# Patient Record
Sex: Male | Born: 1985 | Hispanic: Yes | Marital: Single | Smoking: Never smoker
Health system: Southern US, Community
[De-identification: ages and names within clinical notes are randomized; demographics above are authoritative.]

## PROBLEM LIST (undated history)

## (undated) DIAGNOSIS — K219 Gastro-esophageal reflux disease without esophagitis: Secondary | ICD-10-CM

## (undated) DIAGNOSIS — E785 Hyperlipidemia, unspecified: Secondary | ICD-10-CM

## (undated) DIAGNOSIS — I10 Essential (primary) hypertension: Secondary | ICD-10-CM

## (undated) HISTORY — DX: Essential (primary) hypertension: I10

## (undated) HISTORY — DX: Hyperlipidemia, unspecified: E78.5

## (undated) HISTORY — DX: Gastro-esophageal reflux disease without esophagitis: K21.9

---

## 2018-10-30 ENCOUNTER — Encounter: Payer: Self-pay | Admitting: Gastroenterology

## 2018-11-11 ENCOUNTER — Encounter: Payer: Self-pay | Admitting: Gastroenterology

## 2018-11-11 ENCOUNTER — Other Ambulatory Visit: Payer: Self-pay

## 2018-11-11 ENCOUNTER — Ambulatory Visit: Payer: Self-pay | Admitting: Gastroenterology

## 2018-11-11 VITALS — BP 136/90 | HR 68 | Temp 98.2°F | Ht 63.0 in | Wt 168.2 lb

## 2018-11-11 DIAGNOSIS — Z1159 Encounter for screening for other viral diseases: Secondary | ICD-10-CM

## 2018-11-11 DIAGNOSIS — R079 Chest pain, unspecified: Secondary | ICD-10-CM

## 2018-11-11 DIAGNOSIS — R1310 Dysphagia, unspecified: Secondary | ICD-10-CM

## 2018-11-11 DIAGNOSIS — R7989 Other specified abnormal findings of blood chemistry: Secondary | ICD-10-CM

## 2018-11-11 DIAGNOSIS — R0989 Other specified symptoms and signs involving the circulatory and respiratory systems: Secondary | ICD-10-CM

## 2018-11-11 NOTE — Progress Notes (Signed)
Chief Complaint: Chest tightness  Referring Provider:     Rennis Harding, FNP  HPI:    Rickey Hogan is a 33 y.o. male with a history of dyslipidemia, hypertension, referred to the Gastroenterology Clinic for evaluation of chest tightness and foreign body sensation in the esophagus.  He states he was eating fish and thinks he swallowed a bone approximately 2-3 weeks ago.  Started having chest tightness and foreign body sensation that evening and into the next day.  Describes discomfort suprasternal notch to MEG. No dysphagia, just pain and sensation that something is still there.  No nausea, vomiting. No prior hx of reflux. No prior similar sxs.   Was seen by his PCM for this issue earlier this month.  Prescribed omeprazole 40 mg/day and Carafate with some improvement.   No known family history of CRC, GI malignancy, liver disease, pancreatic disease, or IBD.   Separately, labs from 10/29/2018 n/f elevated LAEs: AST/ALT 44/74 with T bili 1.3 and ALP 109.  Otherwise normal BMP.  No imaging for review. No prior labs for review. No known personal or FHx of liver disease.   History provided via interpretor.   Past Medical History:  Diagnosis Date  . GERD (gastroesophageal reflux disease)   . Hyperlipidemia   . Hypertension      History reviewed. No pertinent surgical history. Family History  Problem Relation Age of Onset  . Colon cancer Neg Hx   . Esophageal cancer Neg Hx    Social History   Tobacco Use  . Smoking status: Never Smoker  . Smokeless tobacco: Never Used  Substance Use Topics  . Alcohol use: Not Currently    Comment: drank a month ago   . Drug use: Never   Current Outpatient Medications  Medication Sig Dispense Refill  . atorvastatin (LIPITOR) 10 MG tablet 1 tablet daily.    Marland Kitchen lisinopril (ZESTRIL) 20 MG tablet 1 tablet daily.    Marland Kitchen omeprazole (PRILOSEC) 40 MG capsule 1 capsule daily.    . sucralfate (CARAFATE) 1 g tablet 1 tablet QID.     No  current facility-administered medications for this visit.    Not on File   Review of Systems: All systems reviewed and negative except where noted in HPI.     Physical Exam:    Wt Readings from Last 3 Encounters:  11/11/18 168 lb 4 oz (76.3 kg)    BP 136/90   Pulse 68   Temp 98.2 F (36.8 C)   Ht 5\' 3"  (1.6 m)   Wt 168 lb 4 oz (76.3 kg)   BMI 29.80 kg/m  Constitutional:  Pleasant, in no acute distress. Psychiatric: Normal mood and affect. Behavior is normal. EENT: Pupils normal.  Conjunctivae are normal. No scleral icterus. Neck supple. No cervical LAD. Cardiovascular: Normal rate, regular rhythm. No edema Pulmonary/chest: Effort normal and breath sounds normal. No wheezing, rales or rhonchi. Abdominal: Soft, nondistended, nontender. Bowel sounds active throughout. There are no masses palpable. No hepatomegaly. Neurological: Alert and oriented to person place and time. Skin: Skin is warm and dry. No rashes noted.   ASSESSMENT AND PLAN;   1) Chest tightness 2) Non-cardaic chest pain 3) Globus sensation  - EGD to r/o mucosal/luminal etiology or e/o silent reflux esophagitis - Resume Prilosec and Carafate  4) Elevated ALT/AST: 5) Borderline Elevated T bili -RUQ - Repeat LAEs - Check viral hep panel  The indications, risks, and  benefits of EGD were explained to the patient in detail. Risks include but are not limited to bleeding, perforation, adverse reaction to medications, and cardiopulmonary compromise. Sequelae include but are not limited to the possibility of surgery, hositalization, and mortality. The patient verbalized understanding and wished to proceed. All questions answered, referred to scheduler. Further recommendations pending results of the exam.     Lavena Bullion, DO, FACG  11/11/2018, 10:46 AM   Salvadore Farber, FNP

## 2018-11-11 NOTE — Patient Instructions (Signed)
If you are age 35 or older, your body mass index should be between 23-30. Your Body mass index is 29.8 kg/m. If this is out of the aforementioned range listed, please consider follow up with your Primary Care Provider.  If you are age 53 or younger, your body mass index should be between 19-25. Your Body mass index is 29.8 kg/m. If this is out of the aformentioned range listed, please consider follow up with your Primary Care Provider.   To help prevent the possible spread of infection to our patients, communities, and staff; we will be implementing the following measures:  As of now we are not allowing any visitors/family members to accompany you to any upcoming appointments with [facility]. If you have any concerns about this please contact our office to discuss prior to the appointment.   You have been scheduled for an endoscopy. Please follow written instructions given to you at your visit today. If you use inhalers (even only as needed), please bring them with you on the day of your procedure. Your physician has requested that you go to www.startemmi.com and enter the access code given to you at your visit today. This web site gives a general overview about your procedure. However, you should still follow specific instructions given to you by our office regarding your preparation for the procedure.  Due to recent COVID-19 restrictions implemented by [employer] and state authorities and in an effort to keep both patients and staff as safe as possible, [place] requires COVID-19 testing prior to any scheduled endoscopic procedure. The testing center is located at [address] [place], [place] [address] in the [facility] [employer]  suite.  Your appointment has been scheduled for 3:00pm on 11/19/2018.   Please bring your insurance cards to this appointment. You will require your COVID screen 2 business days prior to your  endoscopic procedure.  You are not required to quarantine after your screening.  You will only receive a phone call with the results if it is POSITIVE.  If you do not receive a call the day before your procedure you should begin your prep, if ordered, and you should report to the endo center for your procedure at your designated appointment arrival time ( one hour prior to the procedure time). There is no cost to you for the screening on the day of the swab.  [facility] will file with your insurance company for the testing.    You may receive an automated phone call prior to your procedure or have a message in your MyChart that you have an appointment for a BP/15 at the [facility], please disregard this message.  Your testing will be at the [address] [place] location.   If you are leaving [facility] travel [place] on [address] [place] [address], turn left onto [address], turn night onto [address] at the 1st stop light turn right, pass the [employer] on your right and proceed to [place] (white building).    You have been scheduled for an abdominal ultrasound at [facility] (1st floor [address] ). Please arrive 15 minutes prior to your appointment for registration. Make certain not to have anything to eat or drink 6 hours prior to your appointment. Should you need to reschedule your appointment, please contact radiology at [phone]. This test typically takes about 30 minutes to perform.  Your provider has  requested that you go to the basement level for lab work at our [place] location ([address] [place] [address]) . Press "B" on the elevator. The lab is located at the first door on the left as you exit the elevator. You may go at whatever time is convienent for you. The current hours of operations are Monday- Friday 7:30am-4:30pm.  It was a pleasure to see you today!  Vito  Cirigliano, D.O.

## 2018-11-14 ENCOUNTER — Other Ambulatory Visit: Payer: Self-pay

## 2018-11-14 ENCOUNTER — Ambulatory Visit (HOSPITAL_BASED_OUTPATIENT_CLINIC_OR_DEPARTMENT_OTHER)
Admission: RE | Admit: 2018-11-14 | Discharge: 2018-11-14 | Disposition: A | Discharge status: Home / Self Care | Payer: Self-pay | Source: Ambulatory Visit | Attending: Gastroenterology | Admitting: Gastroenterology

## 2018-11-14 DIAGNOSIS — R7989 Other specified abnormal findings of blood chemistry: Secondary | ICD-10-CM | POA: Insufficient documentation

## 2018-11-14 DIAGNOSIS — R079 Chest pain, unspecified: Secondary | ICD-10-CM | POA: Insufficient documentation

## 2018-11-14 DIAGNOSIS — R1310 Dysphagia, unspecified: Secondary | ICD-10-CM | POA: Insufficient documentation

## 2018-11-19 ENCOUNTER — Other Ambulatory Visit: Payer: Self-pay | Admitting: Gastroenterology

## 2018-11-20 LAB — SARS CORONAVIRUS 2 (TAT 6-24 HRS): SARS Coronavirus 2: NEGATIVE

## 2018-11-21 ENCOUNTER — Other Ambulatory Visit: Payer: Self-pay

## 2018-11-21 ENCOUNTER — Encounter: Payer: Self-pay | Admitting: Gastroenterology

## 2018-11-21 ENCOUNTER — Ambulatory Visit (AMBULATORY_SURGERY_CENTER): Payer: Self-pay | Admitting: Gastroenterology

## 2018-11-21 VITALS — BP 121/76 | HR 64 | Temp 98.5°F | Resp 20

## 2018-11-21 DIAGNOSIS — R1310 Dysphagia, unspecified: Secondary | ICD-10-CM

## 2018-11-21 DIAGNOSIS — K2990 Gastroduodenitis, unspecified, without bleeding: Secondary | ICD-10-CM

## 2018-11-21 DIAGNOSIS — R0989 Other specified symptoms and signs involving the circulatory and respiratory systems: Secondary | ICD-10-CM

## 2018-11-21 DIAGNOSIS — K2970 Gastritis, unspecified, without bleeding: Secondary | ICD-10-CM

## 2018-11-21 DIAGNOSIS — R079 Chest pain, unspecified: Secondary | ICD-10-CM

## 2018-11-21 MED ORDER — SODIUM CHLORIDE 0.9 % IV SOLN: 500.0000 mL | INTRAVENOUS | Status: DC

## 2018-11-21 NOTE — Progress Notes (Signed)
Called to room to assist during endoscopic procedure.  Patient ID and intended procedure confirmed with present staff. Received instructions for my participation in the procedure from the performing physician.  

## 2018-11-21 NOTE — Op Note (Signed)
[facility] Patient Name: Rickey AmelRenato Hogan Procedure Date: 11/21/2018 4:15 PM MRN: [id number] Endoscopist: Doristine LocksVito  , MD Age: [age] Referring MD:  Date of Birth: 04/27/1985 Gender: Male Account #: [id number] Procedure:                Upper GI endoscopy Indications:              Odynophagia, Chest pain (non cardiac), Globus                            sensation                           33 yo male with odynophagia and sensation of                            foreign body in esophagus after eating fish                            recently. Also with non-cardiac chest pain/chest                            tightness. Medicines:                Monitored Anesthesia Care Procedure:                Pre-Anesthesia Assessment:                           - Prior to the procedure, a History and Physical                            was performed, and patient medications and                            allergies were reviewed. The patient's tolerance of                            previous anesthesia was also reviewed. The risks                            and benefits of the procedure and the sedation                            options and risks were discussed with the patient.                            All questions were answered, and informed consent                            was obtained. Prior Anticoagulants: The patient has                            taken no previous anticoagulant or antiplatelet  agents. ASA Grade Assessment: II - A patient with                            mild systemic disease. After reviewing the risks                            and benefits, the patient was deemed in                            satisfactory condition to undergo the procedure.                           After obtaining informed consent, the endoscope was                            passed under direct vision. Throughout the                            procedure, the patient's  blood pressure, pulse, and                            oxygen saturations were monitored continuously. The                            Endoscope was introduced through the mouth, and                            advanced to the second part of duodenum. The upper                            GI endoscopy was accomplished without difficulty.                            The patient tolerated the procedure well. Scope In: Scope Out: Findings:                 The examined esophagus was normal. No areas of                            erythema, edema, rings, strictures, or luminal                            narrowing noted.                           Esophagogastric landmarks were identified: the                            Z-line was found at 35 cm, the gastroesophageal                            junction was found at 35 cm and the site of hiatal  narrowing was found at 35 cm from the incisors.                           The gastroesophageal flap valve was visualized                            endoscopically and classified as Hill Grade II                            (fold present, opens with respiration).                           Scattered minimal inflammation characterized by                            erythema was found in the gastric body and in the                            gastric antrum. Biopsies were taken with a cold                            forceps for Helicobacter pylori testing. Estimated                            blood loss was minimal.                           The duodenal bulb, first portion of the duodenum                            and second portion of the duodenum were normal. Complications:            No immediate complications. Estimated Blood Loss:     Estimated blood loss was minimal. Impression:               - Normal esophagus.                           - Esophagogastric landmarks identified.                           - Gastroesophageal flap valve  classified as Hill                            Grade II (fold present, opens with respiration).                           - Gastritis. Biopsied.                           - Normal duodenal bulb, first portion of the                            duodenum and second portion of the duodenum. Recommendation:           - Patient has a contact number  available for                            emergencies. The signs and symptoms of potential                            delayed complications were discussed with the                            patient. Return to normal activities tomorrow.                            Written discharge instructions were provided to the                            patient.                           - Resume previous diet.                           - Continue present medications.                           - Await pathology results.                           - Return to GI clinic PRN. Rickey Heck, MD 11/21/2018 4:33:40 PM

## 2018-11-21 NOTE — Progress Notes (Signed)
A/ox3, pleased with MAC, report to RN 

## 2018-11-21 NOTE — Patient Instructions (Addendum)
Impression/Recommendations:  Gastritis handout given to patient.  Resume previous diet. Continue present medications.  Return to GI clinic as needed.  Call [phone] if you have any questions or concerns.  Endoscopa alta en los adultos Upper Endoscopy, Adult Una endoscopa alta es un procedimiento que permite observar dentro de la porcin alta del tracto GI (gastrointestinal). La porcin alta del tracto GI se compone de lo siguiente:  La parte del cuerpo que transporta los alimentos desde la boca hasta el estmago (esfago).  El [place].  La primera parte del intestino delgado (duodeno). Este procedimiento tambin se conoce como esofagogastroduodenoscopia (EGD) o gastrostoma. En este procedimiento, el mdico introduce un tubo delgado y flexible (endoscopio) a travs de la boca y por el esfago, hasta el [place]. Se fija una pequea cmara al extremo del tubo. La cmara captura imgenes que se reproducen en un monitor de la sala de examen. Durante este procedimiento, el mdico tambin podra extraer una pequea porcin de tejido para enviarla a un laboratorio a que la examinen con un microscopio (biopsia). El mdico podra realizar una endoscopa alta para diagnosticar distintos tipos de cncer en la porcin alta del tracto GI. Es posible que Solicitor procedimiento para Company secretary causa de ciertas afecciones, como:  Dolor de [place].  Acidez estomacal.  Dolor o problemas al tragar.  Nuseas y vmitos.  Hemorragia estomacal.  lceras estomacales. Informe al mdico acerca de lo siguiente:  Cualquier alergia que tenga.  Todos los [employer], incluidos vitaminas, hierbas, gotas oftlmicas, cremas y [address] de [address].  Cualquier problema previo que usted o algn miembro de su familia haya tenido con los anestsicos.  Cualquier trastorno de la sangre que tenga.  Cirugas a las que se haya sometido.  Cualquier afeccin mdica que tenga.   Si est embarazada o podra estarlo. Cules son los riesgos? En general, se trata de un procedimiento seguro. Sin embargo, pueden ocurrir complicaciones, por ejemplo:  Infeccin.  Sangrado.  Reacciones alrgicas a los medicamentos.  Un desgarro u orificio (perforacin) del esfago, el estmago o el duodeno. Qu ocurre antes del procedimiento? Hidratacin Siga las indicaciones del mdico acerca de mantenerse hidratado, las cuales pueden incluir lo siguiente:  Hasta 2horas antes del procedimiento, puede beber lquidos transparentes, como agua, jugos de fruta sin pulpa, caf negro y t solo.  Restricciones en las comidas y bebidas Siga las indicaciones del mdico respecto de las comidas y bebidas, las cuales pueden incluir lo siguiente:  8 horas antes del procedimiento, no coma alimentos pesados, por ejemplo, carnes, alimentos con alto contenido graso o fritos.  6 horas antes del procedimiento, deje de ingerir comidas o alimentos livianos, como tostadas o cereales.  6 horas antes del procedimiento, deje de tomar [address] o bebidas que [employer].  2 horas antes del procedimiento, deje de beber lquidos transparentes. Medicamentos Consulte al mdico sobre:  Multimedia programmer o suspender los medicamentos que toma habitualmente. Esto es muy importante si toma medicamentos para la diabetes o anticoagulantes.  Tomar medicamentos como aspirina e ibuprofeno. Estos medicamentos pueden tener un efecto anticoagulante en la [place]. No tome estos medicamentos a menos que el mdico se lo indique.  Tomar medicamentos de [employer], vitaminas, hierbas y suplementos. Indicaciones generales  Haga que alguien lo lleve a su casa desde el hospital o la clnica.  Si se ir a su casa inmediatamente despus del procedimiento, pdale a alguien que se quede con usted durante 24horas.  Pregntele al mdico qu medidas se tomarn para ayudar a  prevenir una infeccin. Qu ocurre durante el procedimiento?    Le colocarn una va intravenosa en una de las venas.  Pueden administrarle uno o ms de los siguientes medicamentos: ? Un medicamento para ayudarlo a relajarse (sedante). ? Un medicamento para adormecer la garganta (anestesia local).  Deber recostarse del costado izquierdo sobre una camilla.  El mdico har pasar el endoscopio a travs de la boca y Medical illustrator.  Elmdico utilizar el endoscopio para ver el interior de su esfago, estmago y [place]. Podran realizarse biopsias.  El endoscopio se Charity fundraiser. El procedimiento puede variar segn el mdico y el hospital. Sander Nephew ocurre despus del procedimiento?  Le controlarn la presin arterial, la frecuencia cardaca, la frecuencia respiratoria y Retail buyer de oxgeno en la sangre hasta que le den el alta del hospital o la clnica.  No conduzca durante 24horas si le administraron un sedante durante el procedimiento.  Cuando la garganta ya no est adormecida, le pueden dar lquido para beber.  Es su responsabilidad retirar [employer] del procedimiento. Pregntele al mdico o a alguien del departamento donde se realice el procedimiento cundo estarn [employer]. Resumen  Una endoscopa alta es un procedimiento que permite observar dentro de la porcin alta del tracto GI.  Durante el procedimiento, le colocarn una va intravenosa en una de las venas. Es posible que le administren un medicamento para ayudarlo a Nurse, children's.  Usarn un medicamento para anestesiarle la garganta.  Le introducirn el endoscopio por la boca y lo llevarn Programmer, applications. Esta informacin no tiene Marine scientist el consejo del mdico. Asegrese de hacerle al mdico cualquier pregunta que tenga. Document Released: 10/11/2004 Document Revised: 07/11/2017 Document Reviewed: 07/11/2017 Elsevier Patient Education  2020 [employer].

## 2018-11-25 ENCOUNTER — Telehealth: Payer: Self-pay

## 2018-11-25 NOTE — Telephone Encounter (Signed)
NO ANSWER, MESSAGE LEFT FOR PATIENT. 

## 2018-11-25 NOTE — Telephone Encounter (Signed)
  Follow up Call-  Call back number 11/21/2018  Post procedure Call Back phone  # [phone]  Permission to leave phone message Yes     Patient questions:  Do you have a fever, pain , or abdominal swelling? No. Pain Score  0 *  Have you tolerated food without any problems? Yes.    Have you been able to return to your normal activities? Yes.    Do you have any questions about your discharge instructions: Diet   No. Medications  No. Follow up visit  No.  Do you have questions or concerns about your Care? No.  Actions: * If pain score is 4 or above: 1. No action needed, pain <4.Have you developed a fever since your procedure? no  2.   Have you had an respiratory symptoms (SOB or cough) since your procedure? no  3.   Have you tested positive for COVID 19 since your procedure no  4.   Have you had any family members/close contacts diagnosed with the COVID 19 since your procedure?  no   If yes to any of these questions please route to Joylene John, RN and Alphonsa Gin, Therapist, sports.

## 2018-12-04 ENCOUNTER — Other Ambulatory Visit: Payer: Self-pay | Admitting: Gastroenterology

## 2018-12-05 ENCOUNTER — Other Ambulatory Visit: Payer: Self-pay | Admitting: Gastroenterology

## 2018-12-05 DIAGNOSIS — B9681 Helicobacter pylori [H. pylori] as the cause of diseases classified elsewhere: Secondary | ICD-10-CM

## 2018-12-05 DIAGNOSIS — K2970 Gastritis, unspecified, without bleeding: Secondary | ICD-10-CM

## 2019-01-19 ENCOUNTER — Other Ambulatory Visit (INDEPENDENT_AMBULATORY_CARE_PROVIDER_SITE_OTHER): Payer: Self-pay

## 2019-01-19 DIAGNOSIS — R1310 Dysphagia, unspecified: Secondary | ICD-10-CM

## 2019-01-19 DIAGNOSIS — R7989 Other specified abnormal findings of blood chemistry: Secondary | ICD-10-CM

## 2019-01-19 DIAGNOSIS — R079 Chest pain, unspecified: Secondary | ICD-10-CM

## 2019-01-19 LAB — HEPATIC FUNCTION PANEL
ALT: 38 U/L (ref 0–53)
AST: 23 U/L (ref 0–37)
Albumin: 4.6 g/dL (ref 3.5–5.2)
Alkaline Phosphatase: 83 U/L (ref 39–117)
Bilirubin, Direct: 0.3 mg/dL (ref 0.0–0.3)
Total Bilirubin: 2.4 mg/dL — ABNORMAL HIGH (ref 0.2–1.2)
Total Protein: 7.8 g/dL (ref 6.0–8.3)

## 2019-01-20 ENCOUNTER — Other Ambulatory Visit: Payer: Self-pay

## 2019-01-20 DIAGNOSIS — K2970 Gastritis, unspecified, without bleeding: Secondary | ICD-10-CM

## 2019-01-20 DIAGNOSIS — B9681 Helicobacter pylori [H. pylori] as the cause of diseases classified elsewhere: Secondary | ICD-10-CM

## 2019-01-20 LAB — HEPATITIS C ANTIBODY
Hepatitis C Ab: NONREACTIVE
SIGNAL TO CUT-OFF: 0.02 (ref <1.00)

## 2019-01-20 LAB — HEPATITIS B SURFACE ANTIGEN: Hepatitis B Surface Ag: NONREACTIVE

## 2019-01-20 LAB — HEPATITIS A ANTIBODY, TOTAL: Hepatitis A AB,Total: REACTIVE — AB

## 2019-01-20 LAB — HEPATITIS B SURFACE ANTIBODY,QUALITATIVE: Hep B S Ab: NONREACTIVE

## 2019-01-20 NOTE — Addendum Note (Signed)
Addended by: Jessee Avers on: 01/20/2019 02:47 PM   Modules accepted: Orders

## 2019-01-21 ENCOUNTER — Encounter: Payer: Self-pay | Admitting: Gastroenterology

## 2019-01-21 LAB — HELICOBACTER PYLORI  SPECIAL ANTIGEN
MICRO NUMBER:: 10008491
SPECIMEN QUALITY: ADEQUATE

## 2019-02-10 ENCOUNTER — Encounter: Payer: Self-pay | Admitting: Gastroenterology

## 2019-02-10 ENCOUNTER — Ambulatory Visit: Payer: Self-pay | Admitting: Gastroenterology

## 2019-02-10 ENCOUNTER — Other Ambulatory Visit: Payer: Self-pay

## 2019-02-10 VITALS — BP 126/76 | HR 67 | Temp 98.2°F | Ht 63.0 in | Wt 154.4 lb

## 2019-02-10 DIAGNOSIS — Z8619 Personal history of other infectious and parasitic diseases: Secondary | ICD-10-CM

## 2019-02-10 DIAGNOSIS — Z23 Encounter for immunization: Secondary | ICD-10-CM

## 2019-02-10 DIAGNOSIS — R0989 Other specified symptoms and signs involving the circulatory and respiratory systems: Secondary | ICD-10-CM

## 2019-02-10 NOTE — Progress Notes (Signed)
P  Chief Complaint:    Follow-up of H. pylori gastritis, lab follow-up  GI History: 34 y.o. male with a history of dyslipidemia, hypertension, initially seen in the GI clinic in 10/2018 for feeling of foreign body sensation in the esophagus. Improvement with trial of omeprazole 40 mg/day and Carafate.  Evaluation unrevealing as outlined below.  -EGD (11/2018, Dr. Barron Alvine): Mild H. pylori gastritis, otherwise normal.  Normal esophagus.  Treated with quadruple therapy with confirmation of eradication in 01/2019   Additionally, labs in 10/2018 with incidentally noted elevated LAE's: AST/ALT 44/74 with T bili 1.3 and ALP 109.  Otherwise normal BMP. No prior labs for review. No known personal or FHx of liver disease.  -RUQ [place] (10/2018): Normal -Negative HCV -HAV Ab + c/w immunity -HBsAg-, HBsAb-  : Referred for HBV vaccination -Hepatic panel 01/2019: AST/ALT 23/38, T bili 2.4, D bili 0.3. c/w Gilbert's  HPI:    Patient is a 34 y.o. male presenting to the Gastroenterology Clinic for follow-up.  He states he feels much better after completing quadruple therapy for H. pylori.  Confirmed eradication by labs earlier this month.  No longer taking any PPI and no return of UGI sxs.  Feeling well, tolerating all p.o. intake.  No abdominal pain, nausea, vomiting.  Liver enzymes repeated-normalization of AST/ALT.  Elevated T bili consistent with Gilbert's.  HBV was negative, and presents today to also start HBV vaccine series.  History provided via Spanish interpreter in clinic today.  Review of systems:     No chest pain, no SOB, no fevers, no urinary sx   Past Medical History:  Diagnosis Date  . GERD (gastroesophageal reflux disease)   . Hyperlipidemia   . Hypertension     Patient's surgical history, family medical history, social history, medications and allergies were all reviewed in Epic    Current Outpatient Medications  Medication Sig Dispense Refill  . atorvastatin (LIPITOR)  10 MG tablet 1 tablet daily.    Marland Kitchen lisinopril (ZESTRIL) 20 MG tablet 1 tablet daily.     No current facility-administered medications for this visit.    Physical Exam:     BP 126/76   Pulse 67   Temp 98.2 F (36.8 C)   Ht 5\' 3"  (1.6 m)   Wt 154 lb 6 oz (70 kg)   BMI 27.35 kg/m   GENERAL:  Pleasant male in NAD PSYCH: : Cooperative, normal affect EENT:  conjunctiva pink, mucous membranes moist, neck supple without masses CARDIAC:  RRR, no murmur heard, no peripheral edema PULM: Normal respiratory effort, lungs CTA bilaterally, no wheezing ABDOMEN:  Nondistended, soft, nontender. No obvious masses, no hepatomegaly,  normal bowel sounds SKIN:  turgor, no lesions seen Musculoskeletal:  Normal muscle tone, normal strength NEURO: Alert and oriented x 3, no focal neurologic deficits   IMPRESSION and PLAN:    1) H. pylori gastritis: -Completed quadruple therapy with resolution of upper GI symptoms -Confirmed eradication by labs earlier this month -No further work-up needed at this time  2) Globus sensation: -Resolved.  No recurrence despite having been off all acid suppression therapy for couple of months now -No further work-up needed at this time  3) Need for hepatitis B vaccine: -Initiate HPV vaccine series today  4) Gilbert's Syndrome: -Discussed Gilbert's syndrome at length today.  Discussed benign lab abnormality, and can expect to see exacerbation and labs with acute illness.  Liver enzymes have otherwise normalized -No further work-up needed at this time  F/u prn  I spent 22 minutes of time, including independent review of results as outlined above, communicating results with the patient directly, face-to-face time with the patient, coordinating care, ordering studies and medications as appropriate, and documentation.             [place] ,DO, FACG 02/10/2019, 11:15 AM

## 2019-02-10 NOTE — Patient Instructions (Addendum)
Next Hepatitis B injection will be on 03/13/19 at 3pm  It was a pleasure to see you today!  Vito Cirigliano, D.O.

## 2019-03-13 ENCOUNTER — Other Ambulatory Visit: Payer: Self-pay

## 2021-06-26 IMAGING — US US ABDOMEN LIMITED
1 series · 14 of 25 positions shown · non-contrast
Comparison: None.

CLINICAL DATA: Chest pain and elevated liver enzymes

EXAM:
ULTRASOUND ABDOMEN LIMITED RIGHT UPPER QUADRANT

[Series 1: us abdomen limited · 14 of 71 slices shown]
[im 1/71]
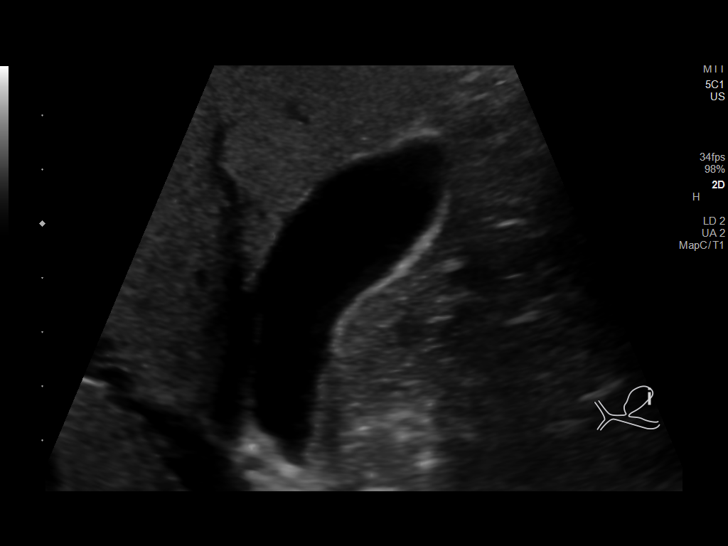
[im 6/71]
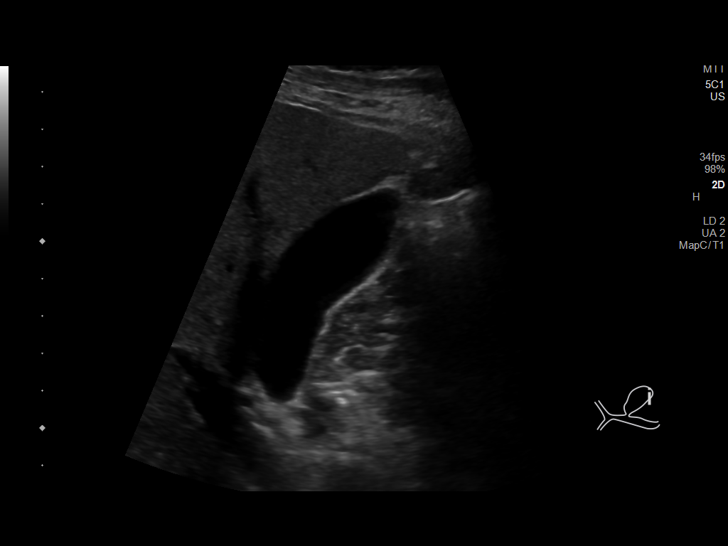
[im 12/71]
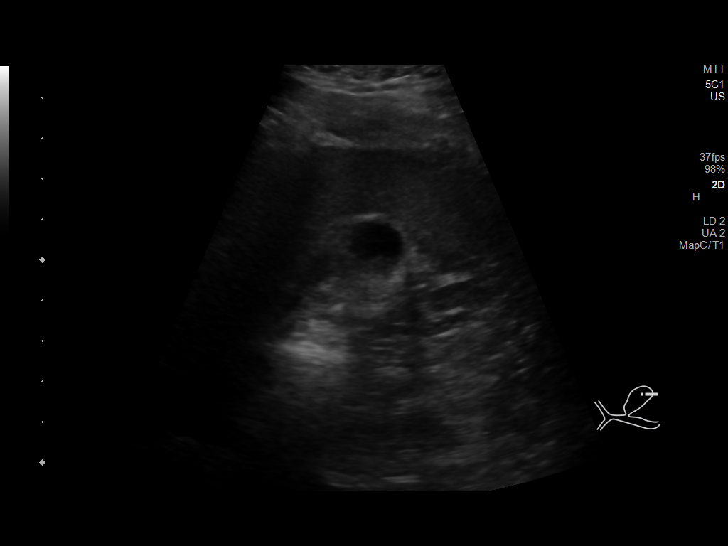
[im 18/71]
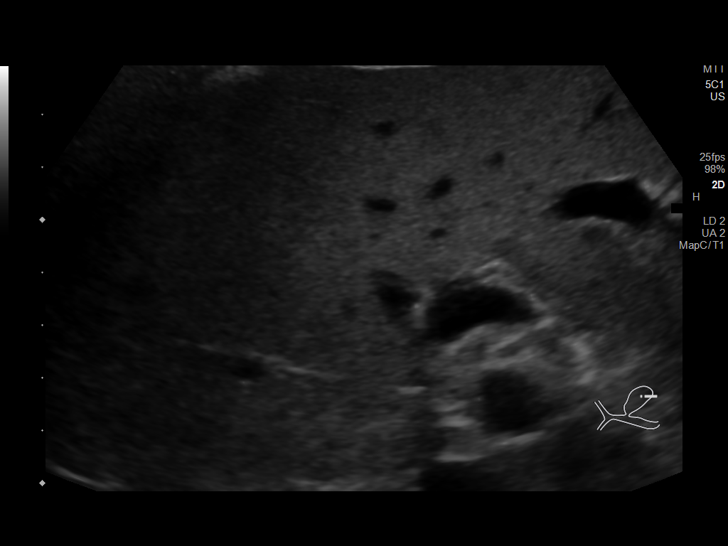
[im 24/71]
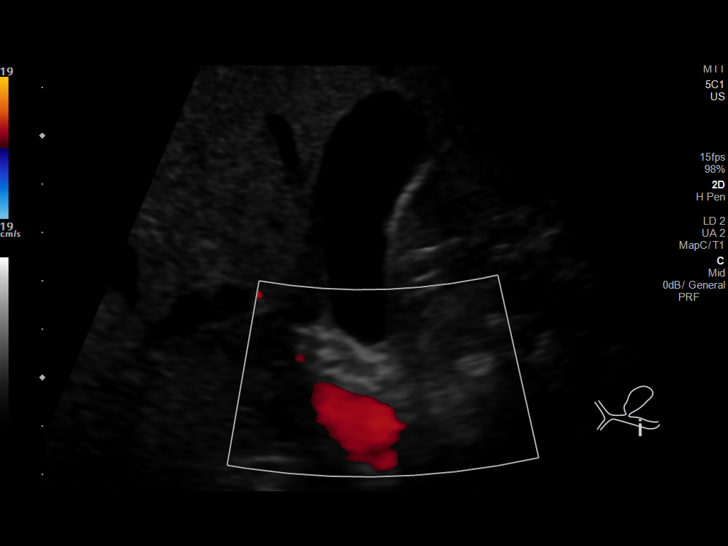
[im 27/71]
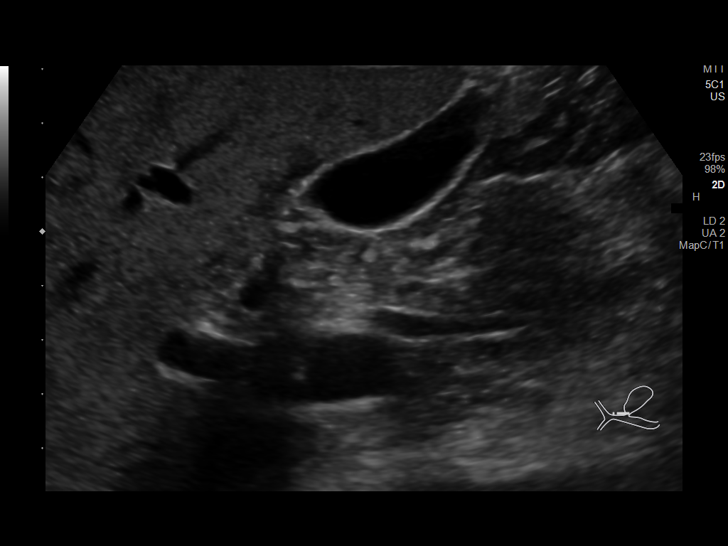
[im 33/71]
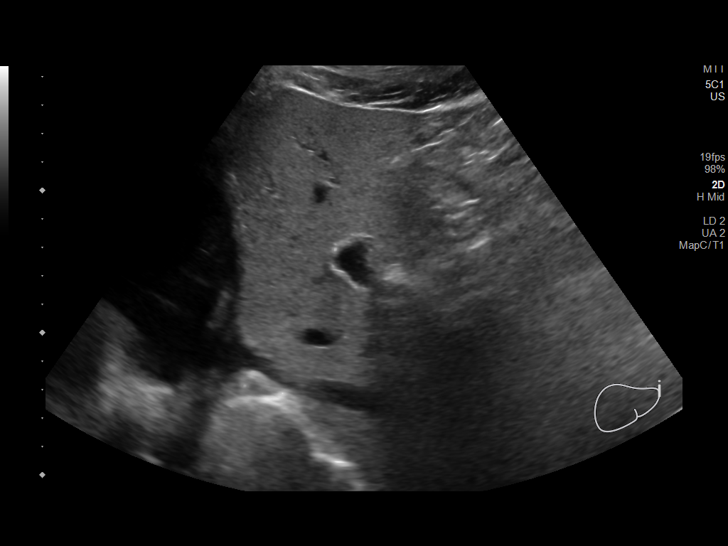
[im 38/71]
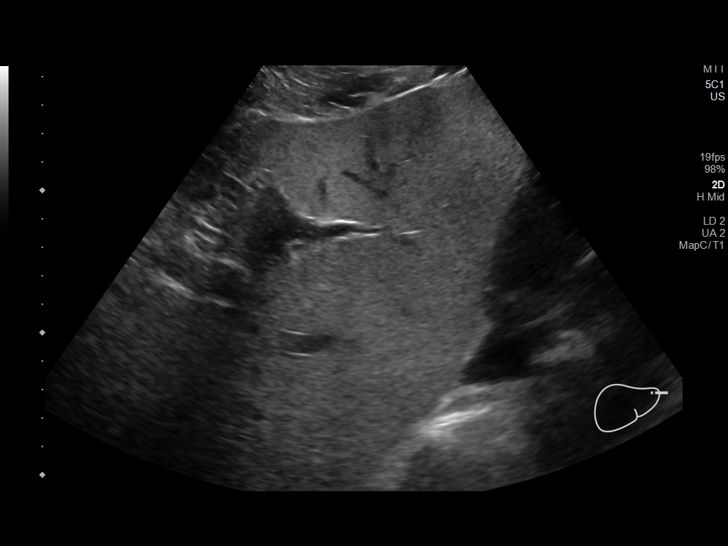
[im 44/71]
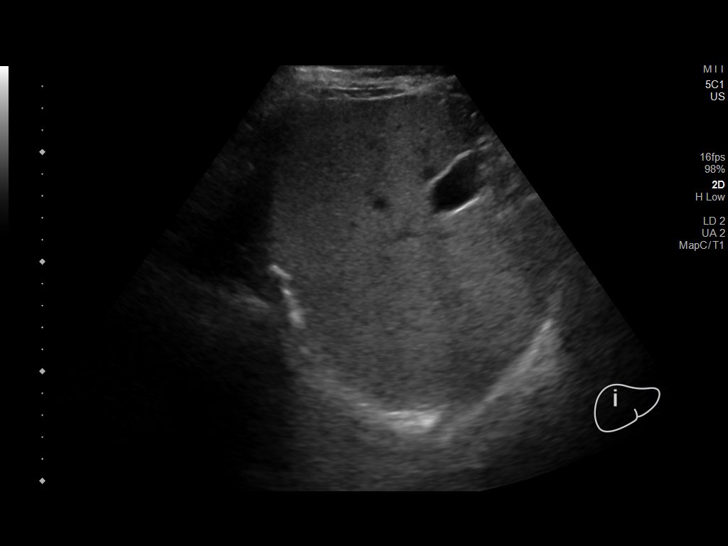
[im 47/71]
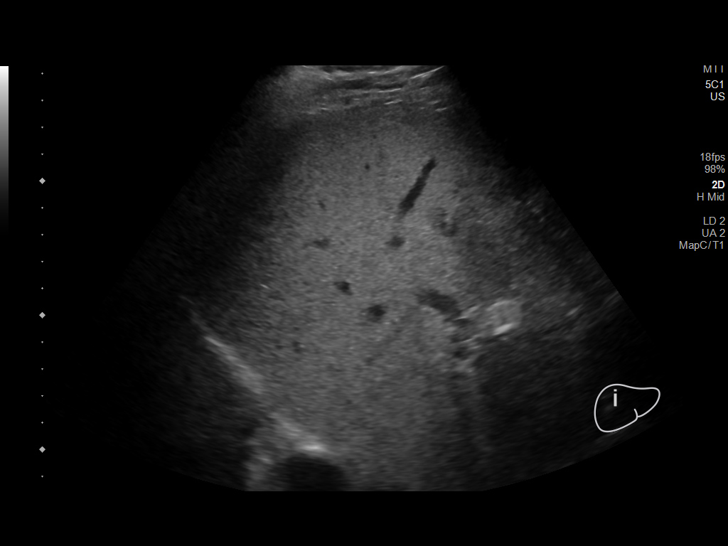
[im 53/71]
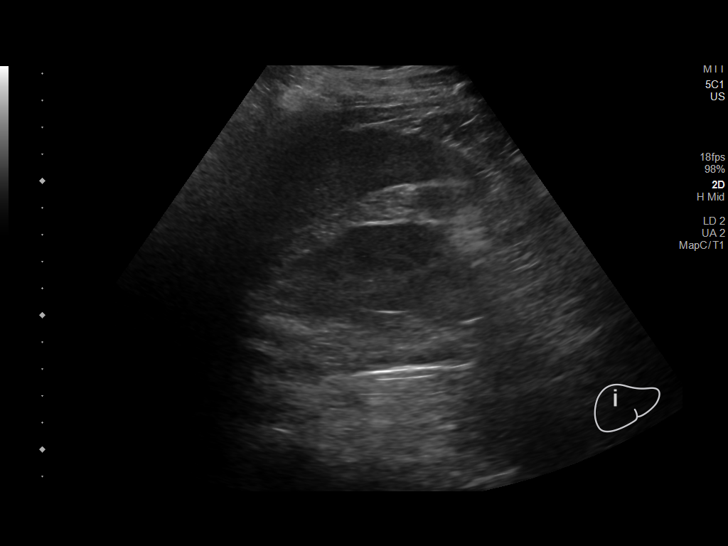
[im 59/71]
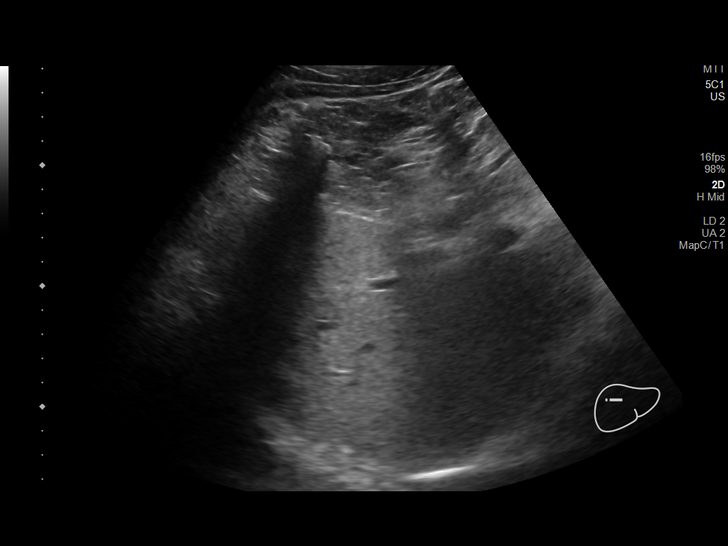
[im 65/71]
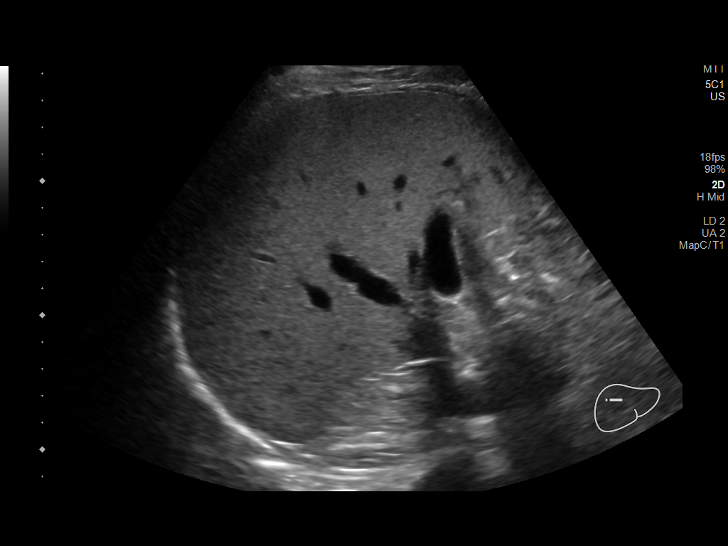
[im 71/71]
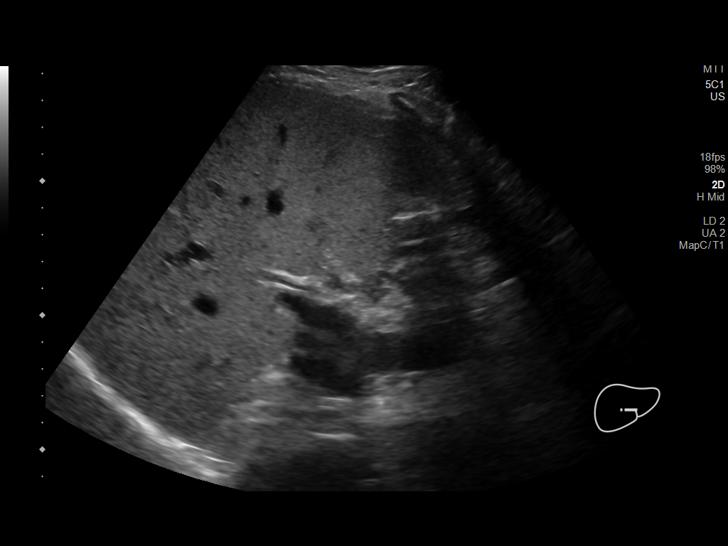

[14 of 25 positions shown; findings below may reference images not displayed]

FINDINGS: Gallbladder:

No gallstones or wall thickening visualized. There is no
pericholecystic fluid. No sonographic Murphy sign noted by
sonographer.

Common bile duct:

Diameter: 3 mm. No intrahepatic or extrahepatic biliary duct
dilatation.

Liver:

No focal lesion identified. Within normal limits in parenchymal
echogenicity. Portal vein is patent on color Doppler imaging with
normal direction of blood flow towards the liver.

Other: None.
IMPRESSION: Study within normal limits.
